# Patient Record
Sex: Male | Born: 1995 | Race: White | Hispanic: No | Marital: Single | Smoking: Current every day smoker
Health system: Southern US, Community
[De-identification: ages and names within clinical notes are randomized; demographics above are authoritative.]

## PROBLEM LIST (undated history)

## (undated) DIAGNOSIS — D6851 Activated protein C resistance: Secondary | ICD-10-CM

## (undated) DIAGNOSIS — G4700 Insomnia, unspecified: Secondary | ICD-10-CM

## (undated) DIAGNOSIS — F419 Anxiety disorder, unspecified: Secondary | ICD-10-CM

## (undated) DIAGNOSIS — F1910 Other psychoactive substance abuse, uncomplicated: Secondary | ICD-10-CM

## (undated) DIAGNOSIS — L309 Dermatitis, unspecified: Secondary | ICD-10-CM

## (undated) DIAGNOSIS — K200 Eosinophilic esophagitis: Secondary | ICD-10-CM

## (undated) DIAGNOSIS — I639 Cerebral infarction, unspecified: Secondary | ICD-10-CM

## (undated) DIAGNOSIS — Z8669 Personal history of other diseases of the nervous system and sense organs: Secondary | ICD-10-CM

## (undated) DIAGNOSIS — M869 Osteomyelitis, unspecified: Secondary | ICD-10-CM

## (undated) DIAGNOSIS — Z87820 Personal history of traumatic brain injury: Secondary | ICD-10-CM

## (undated) DIAGNOSIS — IMO0001 Reserved for inherently not codable concepts without codable children: Secondary | ICD-10-CM

## (undated) DIAGNOSIS — F17200 Nicotine dependence, unspecified, uncomplicated: Secondary | ICD-10-CM

## (undated) HISTORY — DX: Cerebral infarction, unspecified: I63.9

## (undated) HISTORY — DX: Activated protein C resistance: D68.51

## (undated) HISTORY — PX: ANKLE SURGERY: SHX546

---
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
## 2016-07-01 IMAGING — CR DG CHEST 1V PORT
1 series · 1 of 1 positions shown · non-contrast
Comparison: 06/07/2014

CLINICAL DATA: Central line placement.

EXAM:
PORTABLE CHEST - 1 VIEW

[AP]
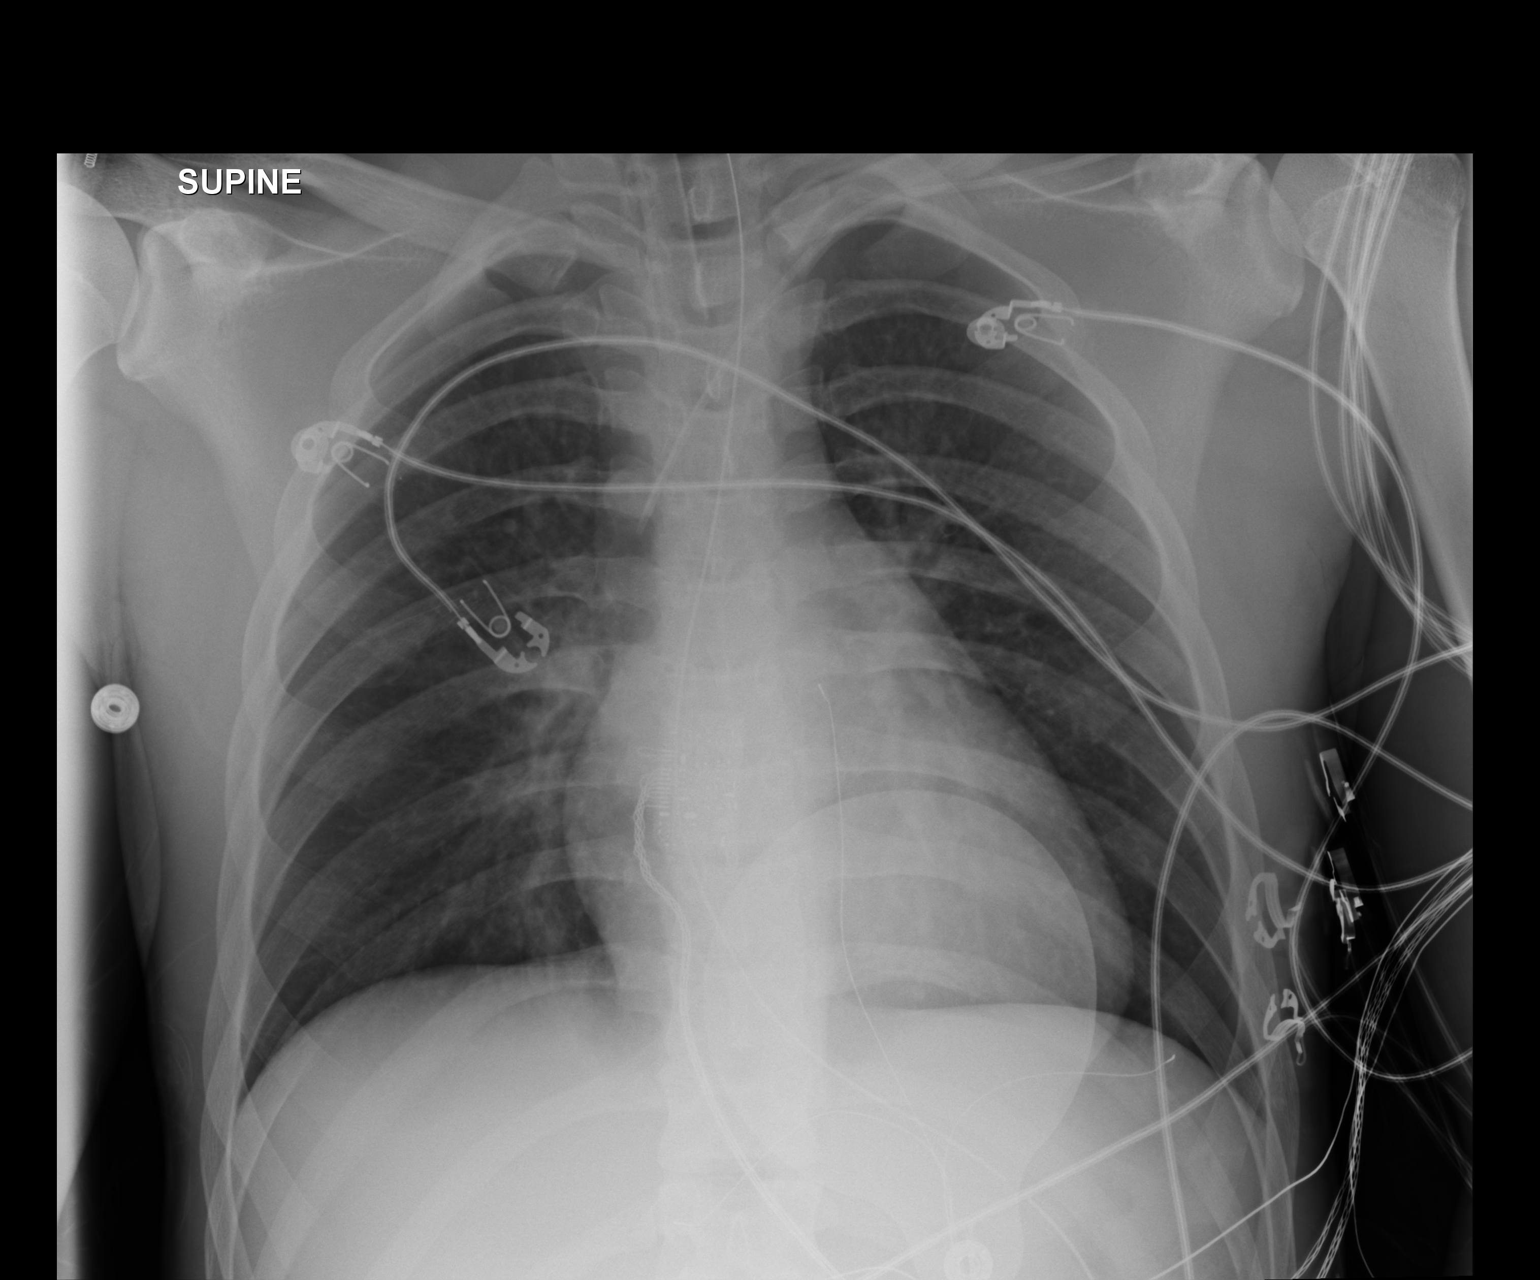

[1 of 1 positions shown; findings below may reference images not displayed]

FINDINGS: Endotracheal tube tip measures 4.9 cm above the carinal. Enteric
tube tip is off the field of view but below the left hemidiaphragm.
Left central venous catheter is been placed with tip over the upper
SVC region. No pneumothorax. Normal heart size and pulmonary
vascularity. Lungs appear clear and expanded. No blunting of
costophrenic angles.
IMPRESSION: Appliances appear in satisfactory location. No evidence of active
pulmonary disease.
